# Patient Record
Sex: Male | Born: 1995 | Race: Black or African American | Hispanic: No | Marital: Single | Smoking: Never smoker
Health system: Southern US, Community
[De-identification: ages and names within clinical notes are randomized; demographics above are authoritative.]

---
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
## 2017-06-03 ENCOUNTER — Encounter (HOSPITAL_BASED_OUTPATIENT_CLINIC_OR_DEPARTMENT_OTHER): Payer: Self-pay | Admitting: *Deleted

## 2017-06-03 ENCOUNTER — Other Ambulatory Visit: Payer: Self-pay

## 2017-06-03 ENCOUNTER — Emergency Department (HOSPITAL_BASED_OUTPATIENT_CLINIC_OR_DEPARTMENT_OTHER): Payer: Medicaid Other

## 2017-06-03 ENCOUNTER — Inpatient Hospital Stay (HOSPITAL_BASED_OUTPATIENT_CLINIC_OR_DEPARTMENT_OTHER)
Admission: EM | Admit: 2017-06-03 | Discharge: 2017-06-05 | DRG: 866 | Disposition: A | Discharge status: Home / Self Care | Payer: Medicaid Other | Attending: Internal Medicine | Admitting: Internal Medicine

## 2017-06-03 DIAGNOSIS — R519 Headache, unspecified: Secondary | ICD-10-CM

## 2017-06-03 DIAGNOSIS — R51 Headache: Secondary | ICD-10-CM

## 2017-06-03 DIAGNOSIS — E872 Acidosis: Secondary | ICD-10-CM | POA: Diagnosis present

## 2017-06-03 DIAGNOSIS — T380X5A Adverse effect of glucocorticoids and synthetic analogues, initial encounter: Secondary | ICD-10-CM | POA: Diagnosis not present

## 2017-06-03 DIAGNOSIS — A419 Sepsis, unspecified organism: Secondary | ICD-10-CM | POA: Diagnosis present

## 2017-06-03 DIAGNOSIS — Z560 Unemployment, unspecified: Secondary | ICD-10-CM | POA: Diagnosis not present

## 2017-06-03 DIAGNOSIS — B349 Viral infection, unspecified: Principal | ICD-10-CM | POA: Diagnosis present

## 2017-06-03 DIAGNOSIS — D696 Thrombocytopenia, unspecified: Secondary | ICD-10-CM | POA: Diagnosis present

## 2017-06-03 DIAGNOSIS — F1290 Cannabis use, unspecified, uncomplicated: Secondary | ICD-10-CM | POA: Diagnosis present

## 2017-06-03 DIAGNOSIS — D72829 Elevated white blood cell count, unspecified: Secondary | ICD-10-CM | POA: Diagnosis present

## 2017-06-03 DIAGNOSIS — D72810 Lymphocytopenia: Secondary | ICD-10-CM | POA: Diagnosis present

## 2017-06-03 DIAGNOSIS — A819 Atypical virus infection of central nervous system, unspecified: Secondary | ICD-10-CM | POA: Diagnosis present

## 2017-06-03 DIAGNOSIS — K219 Gastro-esophageal reflux disease without esophagitis: Secondary | ICD-10-CM | POA: Diagnosis present

## 2017-06-03 LAB — CSF CELL COUNT WITH DIFFERENTIAL
RBC Count, CSF: 1 /mm3 — ABNORMAL HIGH
RBC Count, CSF: 7 /mm3 — ABNORMAL HIGH
Tube #: 1
Tube #: 4
WBC, CSF: 1 /mm3 (ref 0–5)
WBC, CSF: 1 /mm3 (ref 0–5)

## 2017-06-03 LAB — COMPREHENSIVE METABOLIC PANEL
ALT: 9 U/L — ABNORMAL LOW (ref 17–63)
AST: 26 U/L (ref 15–41)
Albumin: 4.6 g/dL (ref 3.5–5.0)
Alkaline Phosphatase: 56 U/L (ref 38–126)
Anion gap: 8 (ref 5–15)
BUN: 13 mg/dL (ref 6–20)
CO2: 26 mmol/L (ref 22–32)
Calcium: 9.2 mg/dL (ref 8.9–10.3)
Chloride: 102 mmol/L (ref 101–111)
Creatinine, Ser: 1.13 mg/dL (ref 0.61–1.24)
GFR calc Af Amer: >60 mL/min (ref >60)
GFR calc non Af Amer: >60 mL/min (ref >60)
Glucose, Bld: 97 mg/dL (ref 65–99)
Potassium: 3.2 mmol/L — ABNORMAL LOW (ref 3.5–5.1)
Sodium: 136 mmol/L (ref 135–145)
Total Bilirubin: 1.3 mg/dL — ABNORMAL HIGH (ref 0.3–1.2)
Total Protein: 7.9 g/dL (ref 6.5–8.1)

## 2017-06-03 LAB — CBC WITH DIFFERENTIAL/PLATELET
Basophils Absolute: 0 10*3/uL (ref 0.0–0.1)
Basophils Relative: 0 %
Eosinophils Absolute: 0 10*3/uL (ref 0.0–0.7)
Eosinophils Relative: 0 %
HCT: 45.4 % (ref 39.0–52.0)
Hemoglobin: 16.3 g/dL (ref 13.0–17.0)
Lymphocytes Relative: 8 %
Lymphs Abs: 0.2 10*3/uL — ABNORMAL LOW (ref 0.7–4.0)
MCH: 32.4 pg (ref 26.0–34.0)
MCHC: 35.9 g/dL (ref 30.0–36.0)
MCV: 90.3 fL (ref 78.0–100.0)
Monocytes Absolute: 0 10*3/uL — ABNORMAL LOW (ref 0.1–1.0)
Monocytes Relative: 0 %
Neutro Abs: 2.4 10*3/uL (ref 1.7–7.7)
Neutrophils Relative %: 92 %
Platelets: 112 10*3/uL — ABNORMAL LOW (ref 150–400)
RBC: 5.03 MIL/uL (ref 4.22–5.81)
RDW: 12 % (ref 11.5–15.5)
WBC: 2.6 10*3/uL — ABNORMAL LOW (ref 4.0–10.5)

## 2017-06-03 LAB — I-STAT CG4 LACTIC ACID, ED
Lactic Acid, Venous: 2.47 mmol/L (ref 0.5–1.9)
Lactic Acid, Venous: 1.99 mmol/L — ABNORMAL HIGH (ref 0.5–1.9)
Lactic Acid, Venous: 2.58 mmol/L (ref 0.5–1.9)

## 2017-06-03 LAB — URINALYSIS, ROUTINE W REFLEX MICROSCOPIC
Bilirubin Urine: NEGATIVE
Glucose, UA: NEGATIVE mg/dL
Hgb urine dipstick: NEGATIVE
Ketones, ur: NEGATIVE mg/dL
Leukocytes, UA: NEGATIVE
Nitrite: NEGATIVE
Protein, ur: NEGATIVE mg/dL
Specific Gravity, Urine: 1.01 (ref 1.005–1.030)
pH: 5.5 (ref 5.0–8.0)

## 2017-06-03 LAB — GLUCOSE, CSF: Glucose, CSF: 62 mg/dL (ref 40–70)

## 2017-06-03 LAB — PROTEIN, CSF: Total  Protein, CSF: 33 mg/dL (ref 15–45)

## 2017-06-03 MED ORDER — HYDROMORPHONE HCL 1 MG/ML IJ SOLN
0.5000 mg | Freq: Once | INTRAMUSCULAR | Status: AC
  Administered 2017-06-03: 0.5 mg via INTRAVENOUS
  Filled 2017-06-03: qty 1

## 2017-06-03 MED ORDER — LORAZEPAM 2 MG/ML IJ SOLN
1.0000 mg | Freq: Once | INTRAMUSCULAR | Status: AC
  Administered 2017-06-03: 1 mg via INTRAVENOUS
  Filled 2017-06-03: qty 1

## 2017-06-03 MED ORDER — KETOROLAC TROMETHAMINE 15 MG/ML IJ SOLN
15.0000 mg | Freq: Once | INTRAMUSCULAR | Status: AC
Start: 2017-06-03 — End: 2017-06-03
  Administered 2017-06-03: 15 mg via INTRAVENOUS
  Filled 2017-06-03: qty 1

## 2017-06-03 MED ORDER — SODIUM CHLORIDE 0.9 % IV SOLN
2.0000 g | Freq: Once | INTRAVENOUS | Status: AC
  Administered 2017-06-03: 2 g via INTRAVENOUS
  Filled 2017-06-03: qty 20

## 2017-06-03 MED ORDER — ONDANSETRON HCL 4 MG/2ML IJ SOLN
INTRAMUSCULAR | Status: AC
  Administered 2017-06-03: 4 mg via INTRAVENOUS
  Filled 2017-06-03: qty 2

## 2017-06-03 MED ORDER — DEXAMETHASONE SODIUM PHOSPHATE 10 MG/ML IJ SOLN
10.0000 mg | Freq: Once | INTRAMUSCULAR | Status: AC
  Administered 2017-06-03: 10 mg via INTRAVENOUS
  Filled 2017-06-03: qty 1

## 2017-06-03 MED ORDER — VANCOMYCIN HCL 10 G IV SOLR
1500.0000 mg | Freq: Once | INTRAVENOUS | Status: AC
  Administered 2017-06-03: 1500 mg via INTRAVENOUS
  Filled 2017-06-03: qty 1500

## 2017-06-03 MED ORDER — LIDOCAINE HCL (PF) 1 % IJ SOLN
INTRAMUSCULAR | Status: AC
  Administered 2017-06-03: 5 mL
  Filled 2017-06-03: qty 5

## 2017-06-03 MED ORDER — ONDANSETRON HCL 4 MG/2ML IJ SOLN
4.0000 mg | Freq: Once | INTRAMUSCULAR | Status: AC
  Administered 2017-06-03: 4 mg via INTRAVENOUS

## 2017-06-03 MED ORDER — LACTATED RINGERS IV BOLUS
1000.0000 mL | Freq: Once | INTRAVENOUS | Status: AC
Start: 2017-06-03 — End: 2017-06-03
  Administered 2017-06-03: 1000 mL via INTRAVENOUS

## 2017-06-03 MED ORDER — VANCOMYCIN HCL 1000 MG IV SOLR
INTRAVENOUS | Status: AC
  Filled 2017-06-03: qty 1000

## 2017-06-03 MED ORDER — ACETAMINOPHEN 500 MG PO TABS
ORAL_TABLET | ORAL | Status: AC
  Administered 2017-06-03: 1000 mg
  Filled 2017-06-03: qty 2

## 2017-06-03 MED ORDER — VANCOMYCIN HCL 500 MG IV SOLR
INTRAVENOUS | Status: AC
  Filled 2017-06-03: qty 500

## 2017-06-03 MED ORDER — SODIUM CHLORIDE 0.9 % IV BOLUS
1000.0000 mL | Freq: Once | INTRAVENOUS | Status: AC
  Administered 2017-06-03: 1000 mL via INTRAVENOUS

## 2017-06-03 MED ORDER — LACTATED RINGERS IV BOLUS
1000.0000 mL | Freq: Once | INTRAVENOUS | Status: AC
  Administered 2017-06-03: 1000 mL via INTRAVENOUS
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
## 2017-06-03 NOTE — ED Triage Notes (Signed)
Pt /o " headache x 30 mins "

## 2017-06-03 NOTE — Plan of Care (Addendum)
22 yo M with 30 min headache, no meningismus, subjective fever only, LP neg for bacterial meningitis.  Tachy to 150s, BPs 70s/40s initially.  Improved after IVF.  CXR, UA also neg.  BCX pending.  Leukopenic.  Will send to med-surg.

## 2017-06-03 NOTE — ED Provider Notes (Signed)
Assumed care from Dr. Juleen China at 3:39 PM. Briefly, the patient is a 22 y.o. male with PMHx of  has no past medical history on file. here with headache, acute onset, ? Low-grade temp. Mild leukopenia on exam. CT neg. DDx includes meningitis, likely viral, SAH, migraine. LA elevated, pt given fluids but no abx yet. LP performed by Dr. Juleen China, awaiting results/response to tx..   Labs Reviewed  COMPREHENSIVE METABOLIC PANEL - Abnormal; Notable for the following components:      Result Value   Potassium 3.2 (*)    ALT 9 (*)    Total Bilirubin 1.3 (*)    All other components within normal limits  CBC WITH DIFFERENTIAL/PLATELET - Abnormal; Notable for the following components:   WBC 2.6 (*)    Platelets 112 (*)    Lymphs Abs 0.2 (*)    Monocytes Absolute 0.0 (*)    All other components within normal limits  I-STAT CG4 LACTIC ACID, ED - Abnormal; Notable for the following components:   Lactic Acid, Venous 2.47 (*)    All other components within normal limits  CULTURE, BLOOD (ROUTINE X 2)  CULTURE, BLOOD (ROUTINE X 2)  CSF CULTURE  URINALYSIS, ROUTINE W REFLEX MICROSCOPIC  GLUCOSE, CSF  PROTEIN, CSF  CSF CELL COUNT WITH DIFFERENTIAL  CSF CELL COUNT WITH DIFFERENTIAL  PATHOLOGIST SMEAR REVIEW    Course of Care: -CSF studies reviewed and are not c/w meningitis. On my exam, pt seems to feel unwell but is non-toxic. He's mentating well. HA has improved. Heart RRR, lungs clear. Abdomen soft, NT, ND, no RLQ or other TTP. He has no rash, denies any recent tick bites, recent travel, or other infectious triggers. LA remains elevated. Concern for ongoing sepsis of unclear etiology. He's been given broad abx, steroids, and fluids. I had a long discussion with him and family, will admit for further observation and treatment.  CRITICAL CARE Performed by: Dollene Cleveland   Total critical care time: 45 minutes  Critical care time was exclusive of separately billable procedures and treating other  patients.  Critical care was necessary to treat or prevent imminent or life-threatening deterioration.  Critical care was time spent personally by me on the following activities: development of treatment plan with patient and/or surrogate as well as nursing, discussions with consultants, evaluation of patient's response to treatment, examination of patient, obtaining history from patient or surrogate, ordering and performing treatments and interventions, ordering and review of laboratory studies, ordering and review of radiographic studies, pulse oximetry and re-evaluation of patient's condition.      Shaune Pollack, MD 06/04/17 [phone]

## 2017-06-03 NOTE — ED Notes (Signed)
ED Provider at bedside. 

## 2017-06-04 DIAGNOSIS — R51 Headache: Secondary | ICD-10-CM

## 2017-06-04 DIAGNOSIS — A819 Atypical virus infection of central nervous system, unspecified: Secondary | ICD-10-CM | POA: Diagnosis present

## 2017-06-04 LAB — BLOOD CULTURE ID PANEL (REFLEXED)

## 2017-06-04 LAB — CBC WITH DIFFERENTIAL/PLATELET
BASOS PCT: 0 %
Basophils Absolute: 0 10*3/uL (ref 0.0–0.1)
EOS ABS: 0 10*3/uL (ref 0.0–0.7)
Eosinophils Relative: 0 %
HCT: 40.8 % (ref 39.0–52.0)
Hemoglobin: 14.2 g/dL (ref 13.0–17.0)
Lymphocytes Relative: 2 %
Lymphs Abs: 0.4 10*3/uL — ABNORMAL LOW (ref 0.7–4.0)
MCH: 32.1 pg (ref 26.0–34.0)
MCHC: 34.8 g/dL (ref 30.0–36.0)
MCV: 92.1 fL (ref 78.0–100.0)
Monocytes Absolute: 0.6 10*3/uL (ref 0.1–1.0)
Monocytes Relative: 3 %
NEUTROS ABS: 22 10*3/uL — AB (ref 1.7–7.7)
NEUTROS PCT: 95 %
Platelets: 117 10*3/uL — ABNORMAL LOW (ref 150–400)
RBC: 4.43 MIL/uL (ref 4.22–5.81)
RDW: 12.3 % (ref 11.5–15.5)
WBC: 23 10*3/uL — AB (ref 4.0–10.5)

## 2017-06-04 LAB — COMPREHENSIVE METABOLIC PANEL
ALBUMIN: 3.6 g/dL (ref 3.5–5.0)
ALK PHOS: 48 U/L (ref 38–126)
ALT: 74 U/L — AB (ref 17–63)
ANION GAP: 12 (ref 5–15)
AST: 104 U/L — AB (ref 15–41)
BUN: 12 mg/dL (ref 6–20)
CHLORIDE: 106 mmol/L (ref 101–111)
CO2: 22 mmol/L (ref 22–32)
Calcium: 8.9 mg/dL (ref 8.9–10.3)
Creatinine, Ser: 0.95 mg/dL (ref 0.61–1.24)
GFR calc non Af Amer: >60 mL/min (ref >60)
Glucose, Bld: 112 mg/dL — ABNORMAL HIGH (ref 65–99)
Potassium: 3.8 mmol/L (ref 3.5–5.1)
SODIUM: 140 mmol/L (ref 135–145)
Total Bilirubin: 0.8 mg/dL (ref 0.3–1.2)
Total Protein: 6.7 g/dL (ref 6.5–8.1)

## 2017-06-04 LAB — MONONUCLEOSIS SCREEN: MONO SCREEN: NEGATIVE

## 2017-06-04 LAB — PATHOLOGIST SMEAR REVIEW

## 2017-06-04 MED ORDER — SODIUM CHLORIDE 0.9 % IV SOLN
1.0000 g | Freq: Two times a day (BID) | INTRAVENOUS | Status: DC
  Administered 2017-06-04 – 2017-06-05 (×3): 1 g via INTRAVENOUS
  Filled 2017-06-04: qty 10
  Filled 2017-06-04 (×3): qty 1

## 2017-06-04 MED ORDER — VANCOMYCIN HCL 10 G IV SOLR
1500.0000 mg | Freq: Once | INTRAVENOUS | Status: AC
  Administered 2017-06-04: 1500 mg via INTRAVENOUS
  Filled 2017-06-04: qty 1500

## 2017-06-04 MED ORDER — SODIUM CHLORIDE 0.9 % IV SOLN
INTRAVENOUS | Status: AC
  Administered 2017-06-04: 13:00:00 via INTRAVENOUS

## 2017-06-04 MED ORDER — VANCOMYCIN HCL 10 G IV SOLR
1750.0000 mg | Freq: Two times a day (BID) | INTRAVENOUS | Status: DC
  Administered 2017-06-04 – 2017-06-05 (×2): 1750 mg via INTRAVENOUS
  Filled 2017-06-04 (×2): qty 1750

## 2017-06-04 NOTE — ED Notes (Signed)
Pt alert and oriented x 4.  Denies any issues at present.  Denies blurry vision, no headache,  No weakness, numbness or tingling.  No neuro deficits noted.  Pt aware of admisssion to [facility], denies needs or c/o.

## 2017-06-04 NOTE — ED Notes (Signed)
Care link here to pick up patient 

## 2017-06-04 NOTE — H&P (Signed)
HPI  Jon Frederick ZOX:[id number] DOB: Jun 02, 1995 DOA: 06/03/2017  PCP: Patient, No Pcp Per   Chief Complaint: Headache discomfort  HPI:  22 year old male with no real past medical history other than chest pain evaluated 04/26/2016--[GERD ]reflux-routine echo performed showed no acute findings and it was felt there was not some underlying anxiety There is some past medical history of substance abuse??--Patient however claims not to use any illicit drugs except occasional marijuana he has not used in the past  History of headache-fever-need his mother 5/13 for her tonsillectomy    ED Course: Med center [facility] 5/13-started on ceftriaxone vancomycin Toradol and Dilaudid for severe headaches and tachycardia-LP performed at that time showed no new findings  Review of Systems:  nad No myalgias, - diarrhea, - chills, - Reiger, - lower extremity edema, - rash, - cough cold, mild abdominal pain, - bleeding  History reviewed. No pertinent past medical history.  History reviewed. No pertinent surgical history.   reports that he has never smoked. He has never used smokeless tobacco. His alcohol and drug histories are not on file. Mobility: Dependent at baseline Hi Risk sexual behavior personally, first had sex at age 66 has had over 10 partners Currently unemployed Wishes to breed dogs if no exotic pets at home  No Known Allergies  Family History  Problem Relation Age of Onset  . Sudden death Neg Hx   . Heart attack Neg Hx      Prior to Admission medications   Medication Sig Start Date End Date Taking? Authorizing Provider  naproxen sodium (ALEVE) 220 MG tablet Take 440 mg by mouth 2 (two) times daily as needed (For headache.).    Yes [provider]    Physical Exam:  Vitals:   06/04/17 0800 06/04/17 0815  BP: 133/72   Pulse: 73   Resp: 19   Temp:  97.6 F (36.4 C)  SpO2: 99%      Alert coherent in no distress extraocular movements intact no icterus no pallor  no submandibular lymphadenopathy no thyromegaly chest is clinically clear  S1-S2 no murmur rub or gallop  Skin has tattoos over both forearms-both ears are pierced  No lower extremity edema  S1-S2 I do not appreciate any murmur whatsoever  Range of motion intact no swelling of any joints and musculoskeletal system neurologically intact without deficit  I have personally reviewed following labs and imaging studies  Labs:   Potassium 3.2 lactic acid 2.4  WBC 2.6 platelet count 112 predominant left shift with lymphocytopenia  Imaging studies:   CT head was normal  Chest x-ray was negative for active cardiopulmonary disease  Medical tests:   EKG independently reviewed: Sinus tachycardia with leftward axis although it is possibly secondary to his young age and thin chest wall  Test discussed with performing physician:  Discussed with no one  Decision to obtain old records:   Yes  Review and summation of old records:   Summarized and extensively reviewed  Active Problems:   Sepsis (HCC)   Assessment/Plan  Infectious etiology with leukopenia and thrombocytopenia and mild lymphocytopenia-I will rule out infectious mononucleosis HIV and hepatitis given he has high risk sexual behavior >10 partners CBC plus differential now as well as INR and complete metabolic panel If work-up otherwise unrevealing, will consider de-escalation of antibiotics a.m. 5/15 I will continue however broad-spectrum ceftriaxone vancomycin until that time--follow-up blood cultures that were drawn as well as CSF cultures although the lab work was predominantly benign  Seen in  the past for chest pain  ?  Drug abuse-he uses marijuana does not use now-please consider drug screen if lab abnormalities persist  Severity of Illness: The appropriate patient status for this patient is INPATIENT. Inpatient status is judged to be reasonable and necessary in order to provide the required intensity of service  to ensure the patient's safety. The patient's presenting symptoms, physical exam findings, and initial radiographic and laboratory data in the context of their chronic comorbidities is felt to place them at high risk for further clinical deterioration. Furthermore, it is not anticipated that the patient will be medically stable for discharge from the hospital within 2 midnights of admission. The following factors support the patient status of inpatient.   " The patient's presenting symptoms include leukopenia thrombocytopenia fever-tach acidosis " The worrisome physical exam findings include at this time. " The initial radiographic and laboratory data are worrisome because of the above. " The chronic co-morbidities include none currently.   * I certify that at the point of admission it is my clinical judgment that the patient will require inpatient hospital care spanning beyond 2 midnights from the point of admission due to high intensity of service, high risk for further deterioration and high frequency of surveillance required.*   Full CODE STATUS  DVT prophylaxis: SCD Code Status: full Family Communication: d/w mother Consults called:  None yet    Time spent: 55 minutes  Coty Larsh, MD  [facility] Direct contact: [phone] --Via amion app OR  --[email or web address]; password [email or web address]  7PM-7AM contact night coverage as above  06/04/2017, 10:27 AM

## 2017-06-04 NOTE — Progress Notes (Signed)
PHARMACY - PHYSICIAN COMMUNICATION CRITICAL VALUE ALERT - BLOOD CULTURE IDENTIFICATION (BCID)  Jon Frederick is an 22 y.o. male who presented to [facility] on 06/03/2017 with a chief complaint of headache  Assessment:  Pt was admitted with headache. Pt underwent an LP which was unremarkable. However, the indication on ceftriaxone indicates that it is for meningitis. Spoke to Dr. Mahala Menghini, who clarified that he does not think pt has meningitis. Asked if doses of vanc and ceftriaxone needed to be adjusted to doses to cover meningitis, but Dr. Mahala Menghini stated that they do not as he does not suspect meningitis. However he did want to continue vanc and ceftriaxone at non-meningitis dosing for sepsis. Informed MD also of positive BCID.  Therefore resumed vancomycin per MD request. - Keep ceftriaxone at 1 gr IV q12h - Pt received vancomycin 1500 at 1813 on 5/13 and at 1237 on 5/14, start vancomycin 1750 mg IV q12h    Name of physician (or Provider) Contacted: Dr. Mahala Menghini  Current antibiotics: Ceftriaxone and vancomycin  Changes to prescribed antibiotics recommended:  Recommendations declined by provider due to wanting coverage for at least 24 hours upon admission  Results for orders placed or performed during the hospital encounter of 06/03/17  Blood Culture ID Panel (Reflexed) (Collected: 06/03/2017  4:44 PM)  Result Value Ref Range   Enterococcus species NOT DETECTED NOT DETECTED   Listeria monocytogenes NOT DETECTED NOT DETECTED   Staphylococcus species DETECTED (A) NOT DETECTED   Staphylococcus aureus NOT DETECTED NOT DETECTED   Methicillin resistance NOT DETECTED NOT DETECTED   Streptococcus species NOT DETECTED NOT DETECTED   Streptococcus agalactiae NOT DETECTED NOT DETECTED   Streptococcus pneumoniae NOT DETECTED NOT DETECTED   Streptococcus pyogenes NOT DETECTED NOT DETECTED   Acinetobacter baumannii NOT DETECTED NOT DETECTED   Enterobacteriaceae species NOT DETECTED NOT DETECTED   Enterobacter cloacae complex NOT DETECTED NOT DETECTED   Escherichia coli NOT DETECTED NOT DETECTED   Klebsiella oxytoca NOT DETECTED NOT DETECTED   Klebsiella pneumoniae NOT DETECTED NOT DETECTED   Proteus species NOT DETECTED NOT DETECTED   Serratia marcescens NOT DETECTED NOT DETECTED   Haemophilus influenzae NOT DETECTED NOT DETECTED   Neisseria meningitidis NOT DETECTED NOT DETECTED   Pseudomonas aeruginosa NOT DETECTED NOT DETECTED   Candida albicans NOT DETECTED NOT DETECTED   Candida glabrata NOT DETECTED NOT DETECTED   Candida krusei NOT DETECTED NOT DETECTED   Candida parapsilosis NOT DETECTED NOT DETECTED   Candida tropicalis NOT DETECTED NOT DETECTED     Adalberto Cole, PharmD, BCPS Pager [phone] 06/04/2017 7:33 PM

## 2017-06-05 DIAGNOSIS — B349 Viral infection, unspecified: Principal | ICD-10-CM

## 2017-06-05 LAB — COMPREHENSIVE METABOLIC PANEL
ALBUMIN: 3.3 g/dL — AB (ref 3.5–5.0)
ALT: 70 U/L — AB (ref 17–63)
ANION GAP: 8 (ref 5–15)
AST: 71 U/L — AB (ref 15–41)
Alkaline Phosphatase: 64 U/L (ref 38–126)
BILIRUBIN TOTAL: 0.6 mg/dL (ref 0.3–1.2)
BUN: 11 mg/dL (ref 6–20)
CALCIUM: 8.7 mg/dL — AB (ref 8.9–10.3)
CO2: 23 mmol/L (ref 22–32)
Chloride: 108 mmol/L (ref 101–111)
Creatinine, Ser: 1.01 mg/dL (ref 0.61–1.24)
GFR calc Af Amer: >60 mL/min (ref >60)
Glucose, Bld: 97 mg/dL (ref 65–99)
POTASSIUM: 3.8 mmol/L (ref 3.5–5.1)
SODIUM: 139 mmol/L (ref 135–145)
TOTAL PROTEIN: 6.2 g/dL — AB (ref 6.5–8.1)

## 2017-06-05 LAB — CBC
HEMATOCRIT: 39 % (ref 39.0–52.0)
HEMOGLOBIN: 13.5 g/dL (ref 13.0–17.0)
MCH: 31.8 pg (ref 26.0–34.0)
MCHC: 34.6 g/dL (ref 30.0–36.0)
MCV: 92 fL (ref 78.0–100.0)
PLATELETS: 113 10*3/uL — AB (ref 150–400)
RBC: 4.24 MIL/uL (ref 4.22–5.81)
RDW: 12.5 % (ref 11.5–15.5)
WBC: 17.5 10*3/uL — AB (ref 4.0–10.5)

## 2017-06-05 LAB — PROTIME-INR
INR: 1.37
Prothrombin Time: 16.8 seconds — ABNORMAL HIGH (ref 11.4–15.2)

## 2017-06-05 LAB — HEPATITIS PANEL, ACUTE
Hep A IgM: NEGATIVE
Hep B C IgM: NEGATIVE
Hepatitis B Surface Ag: NEGATIVE

## 2017-06-05 LAB — HIV ANTIBODY (ROUTINE TESTING W REFLEX): HIV SCREEN 4TH GENERATION: NONREACTIVE

## 2017-06-05 MED ORDER — NAPROXEN SODIUM 220 MG PO TABS: 220.0000 mg | ORAL_TABLET | Freq: Two times a day (BID) | ORAL | Status: AC | PRN

## 2017-06-05 NOTE — Discharge Instructions (Signed)

## 2017-06-05 NOTE — Progress Notes (Signed)
Discharge instructions explained to pt and all questions were answered.  

## 2017-06-05 NOTE — Progress Notes (Signed)
Spoke with patient at bedside about PCP and follow up care. He called his mother to see who they regularly see. [facility] an appointment. They understand he needs f/u for labs that were done here. No further d/c needs assessed. [phone]
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
## 2017-06-06 LAB — CULTURE, BLOOD (ROUTINE X 2): Special Requests: ADEQUATE

## 2017-06-07 LAB — CSF CULTURE W GRAM STAIN: Culture: NO GROWTH

## 2017-06-07 LAB — CSF CULTURE

## 2017-06-11 LAB — CULTURE, BLOOD (ROUTINE X 2): Special Requests: ADEQUATE
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
## 2020-01-23 ENCOUNTER — Other Ambulatory Visit: Payer: Self-pay

## 2020-01-23 ENCOUNTER — Emergency Department (HOSPITAL_COMMUNITY)
Admission: EM | Admit: 2020-01-23 | Discharge: 2020-01-23 | Disposition: A | Discharge status: Home / Self Care | Payer: Medicaid Other | Attending: Emergency Medicine | Admitting: Emergency Medicine

## 2020-01-23 ENCOUNTER — Encounter (HOSPITAL_COMMUNITY): Payer: Self-pay | Admitting: Emergency Medicine

## 2020-01-23 DIAGNOSIS — R059 Cough, unspecified: Secondary | ICD-10-CM | POA: Diagnosis present

## 2020-01-23 DIAGNOSIS — U071 COVID-19: Secondary | ICD-10-CM

## 2020-01-23 LAB — RESP PANEL BY RT-PCR (FLU A&B, COVID) ARPGX2
Influenza A by PCR: NEGATIVE
Influenza B by PCR: NEGATIVE
SARS Coronavirus 2 by RT PCR: POSITIVE — AB

## 2020-01-23 NOTE — Discharge Instructions (Addendum)
You will need to quarantine at home for a total of 5 days from symptom onset.  After 5 days, if your symptoms are improving and you no longer having fevers, you may end your quarantine.  You will need to wear a mask consistently for the next 5 days following your quarantine period.  You were diagnosed with Covid, this is a viral illness.  It should be treated symptomatically. Use Tylenol and ibuprofen as needed for fever, headache, or body aches.  You may take 3-4 over-the-counter Advil at a time up to 3 times a day with meals.  Use cough syrup as needed.  Make sure you stay well hydrated with water. Return to the ER if you develop difficulty breathing, severe chest pain, or any new, worsening, or concerning symptoms.

## 2020-01-23 NOTE — ED Triage Notes (Signed)
Pt c/o headache, body aches and fever x 2 days.

## 2020-01-23 NOTE — ED Notes (Signed)
Patient verbalized understanding of discharge instructions. Opportunity for questions and answers.  

## 2020-01-23 NOTE — ED Notes (Signed)
Pt denies sob,chest pain, n and v.

## 2020-01-23 NOTE — ED Provider Notes (Signed)
[facility] EMERGENCY DEPARTMENT Provider Note   CSN: [id number] Arrival date & time: 01/23/20  1648     History Chief Complaint  Patient presents with  . Headache    Jon Frederick is a 25 y.o. male presenting for evaluation of 2 day h/o headache, body ache, fever, cough, nasal congestion, sore throat, decreased appetite.  Patient states that symptoms began 2 days ago.  He took 1 Advil earlier today, has not taken anything since.  He denies known Covid exposure.  He reports a mild, nonproductive cough.  No chest pain or shortness of breath.  He reports body aches, headache.  He reports decreased appetite, but no loss of taste or smell.  He denies nausea, vomiting abdominal pain, urinary symptoms, normal bowel movements.  He has no medical problems, takes no medications daily.  No history of asthma or COPD.  He is not smoke cigarettes.  He is not vaccinated for Covid.  HPI     History reviewed. No pertinent past medical history.  Patient Active Problem List   Diagnosis Date Noted  . Acute viral syndrome 06/05/2017  . Sports physical 06/01/2014    History reviewed. No pertinent surgical history.     Family History  Problem Relation Age of Onset  . Sudden death Neg Hx   . Heart attack Neg Hx     Social History   Tobacco Use  . Smoking status: Never Smoker  . Smokeless tobacco: Never Used    Home Medications Prior to Admission medications   Medication Sig Start Date End Date Taking? Authorizing Provider  naproxen sodium (ALEVE) 220 MG tablet Take 1 tablet (220 mg total) by mouth 2 (two) times daily as needed (For headache.). 06/05/17   Hongalgi, Maximino Greenland, MD    Allergies    Patient has no known allergies.  Review of Systems   Review of Systems  Constitutional: Positive for appetite change and fever.  HENT: Positive for congestion and sore throat.   Respiratory: Positive for cough.   Musculoskeletal: Positive for myalgias.  Neurological: Positive  for headaches.  All other systems reviewed and are negative.   Physical Exam Updated Vital Signs BP (!) 129/99   Pulse 82   Temp 98.1 F (36.7 C) (Oral)   Resp 16   Ht 6\' 3"  (1.905 m)   Wt 88.5 kg   SpO2 100%   BMI 24.37 kg/m   Physical Exam Vitals and nursing note reviewed.  Constitutional:      General: He is not in acute distress.    Appearance: He is well-developed and well-nourished.     Comments: Resting in the bed in no acute distress  HENT:     Head: Normocephalic and atraumatic.  Eyes:     Extraocular Movements: EOM normal.     Conjunctiva/sclera: Conjunctivae normal.     Pupils: Pupils are equal, round, and reactive to light.  Cardiovascular:     Rate and Rhythm: Normal rate and regular rhythm.     Pulses: Intact distal pulses.  Pulmonary:     Effort: Pulmonary effort is normal. No respiratory distress.     Breath sounds: Normal breath sounds. No wheezing.     Comments: Clear lung sounds in all fields.  No wheezing, rales, rhonchi.  Speaking full sentences.  Sats stable on room air. Abdominal:     General: Bowel sounds are normal. There is no distension.     Palpations: Abdomen is soft.     Tenderness: There is  no abdominal tenderness.  Musculoskeletal:        General: Normal range of motion.     Cervical back: Normal range of motion and neck supple.  Skin:    General: Skin is warm and dry.     Capillary Refill: Capillary refill takes less than 2 seconds.  Neurological:     Mental Status: He is alert and oriented to person, place, and time.  Psychiatric:        Mood and Affect: Mood and affect normal.     ED Results / Procedures / Treatments   Labs (all labs ordered are listed, but only abnormal results are displayed) Labs Reviewed  RESP PANEL BY RT-PCR (FLU A&B, COVID) [id number] - Abnormal; Notable for the following components:      Result Value   SARS Coronavirus 2 by RT PCR POSITIVE (*)    All other components within normal limits     EKG None  Radiology No results found.  Procedures Procedures (including critical care time)  Medications Ordered in ED Medications - No data to display  ED Course  I have reviewed the triage vital signs and the nursing notes.  Pertinent labs & imaging results that were available during my care of the patient were reviewed by me and considered in my medical decision making (see chart for details).    MDM Rules/Calculators/A&P                          Patient presenting for evaluation of 2-day history of Covid symptoms.  On exam, patient nontoxic.  Pulmonary exam is overall reassuring.  Sats are stable.  As such, doubt secondary bacterial infection, I do not like he needs a chest x-ray.  Covid test obtained from the waiting room reviewed by me, is positive.  Likely the cause of his symptoms.  Discussed that this is a viral illness that will need to be treated symptomatically.  Discussed importance of monitoring for worsening respiratory symptoms.  At this time, patient appears safe for discharge.  Return precautions given.  Patient states he understands and agrees to plan.  Jon Frederick was evaluated in Emergency Department on 01/23/2020 for the symptoms described in the history of present illness. He was evaluated in the context of the global COVID-19 pandemic, which necessitated consideration that the patient might be at risk for infection with the SARS-CoV-2 virus that causes COVID-19. Institutional protocols and algorithms that pertain to the evaluation of patients at risk for COVID-19 are in a state of rapid change based on information released by regulatory bodies including the CDC and federal and state organizations. These policies and algorithms were followed during the patient's care in the ED.   Final Clinical Impression(s) / ED Diagnoses Final diagnoses:  None    Rx / DC Orders ED Discharge Orders    None       Alveria Apley, PA-C 01/23/20 2143    Derwood Kaplan, MD 01/25/20 2332

## 2020-07-29 ENCOUNTER — Emergency Department (HOSPITAL_COMMUNITY): Payer: Medicaid Other

## 2020-07-29 ENCOUNTER — Other Ambulatory Visit: Payer: Self-pay

## 2020-07-29 ENCOUNTER — Encounter (HOSPITAL_COMMUNITY): Payer: Self-pay | Admitting: Emergency Medicine

## 2020-07-29 ENCOUNTER — Emergency Department (HOSPITAL_COMMUNITY)
Admission: EM | Admit: 2020-07-29 | Discharge: 2020-07-29 | Disposition: A | Discharge status: Home / Self Care | Payer: Medicaid Other | Attending: Emergency Medicine | Admitting: Emergency Medicine

## 2020-07-29 DIAGNOSIS — N23 Unspecified renal colic: Secondary | ICD-10-CM | POA: Diagnosis not present

## 2020-07-29 DIAGNOSIS — K389 Disease of appendix, unspecified: Secondary | ICD-10-CM

## 2020-07-29 DIAGNOSIS — R109 Unspecified abdominal pain: Secondary | ICD-10-CM | POA: Diagnosis present

## 2020-07-29 DIAGNOSIS — K381 Appendicular concretions: Secondary | ICD-10-CM | POA: Diagnosis not present

## 2020-07-29 LAB — COMPREHENSIVE METABOLIC PANEL
ALT: 26 U/L (ref 0–44)
AST: 26 U/L (ref 15–41)
Albumin: 3.8 g/dL (ref 3.5–5.0)
Alkaline Phosphatase: 59 U/L (ref 38–126)
Anion gap: 7 (ref 5–15)
BUN: 11 mg/dL (ref 6–20)
CO2: 27 mmol/L (ref 22–32)
Calcium: 9.2 mg/dL (ref 8.9–10.3)
Chloride: 102 mmol/L (ref 98–111)
Creatinine, Ser: 1.12 mg/dL (ref 0.61–1.24)
GFR, Estimated: >60 mL/min (ref >60)
Glucose, Bld: 106 mg/dL — ABNORMAL HIGH (ref 70–99)
Potassium: 4.2 mmol/L (ref 3.5–5.1)
Sodium: 136 mmol/L (ref 135–145)
Total Bilirubin: 0.7 mg/dL (ref 0.3–1.2)
Total Protein: 6.8 g/dL (ref 6.5–8.1)

## 2020-07-29 LAB — URINALYSIS, ROUTINE W REFLEX MICROSCOPIC
Bacteria, UA: NONE SEEN
Bilirubin Urine: NEGATIVE
Glucose, UA: NEGATIVE mg/dL
Ketones, ur: NEGATIVE mg/dL
Nitrite: NEGATIVE
Protein, ur: NEGATIVE mg/dL
Specific Gravity, Urine: 1.023 (ref 1.005–1.030)
pH: 6 (ref 5.0–8.0)

## 2020-07-29 LAB — CBC
HCT: 43.8 % (ref 39.0–52.0)
Hemoglobin: 14.8 g/dL (ref 13.0–17.0)
MCH: 31 pg (ref 26.0–34.0)
MCHC: 33.8 g/dL (ref 30.0–36.0)
MCV: 91.8 fL (ref 80.0–100.0)
Platelets: 155 10*3/uL (ref 150–400)
RBC: 4.77 MIL/uL (ref 4.22–5.81)
RDW: 12.1 % (ref 11.5–15.5)
WBC: 4.7 10*3/uL (ref 4.0–10.5)
nRBC: 0 % (ref 0.0–0.2)

## 2020-07-29 LAB — LIPASE, BLOOD: Lipase: 28 U/L (ref 11–51)

## 2020-07-29 MED ORDER — FENTANYL CITRATE (PF) 100 MCG/2ML IJ SOLN
25.0000 ug | Freq: Once | INTRAMUSCULAR | Status: AC
  Administered 2020-07-29: 25 ug via INTRAVENOUS
  Filled 2020-07-29: qty 2

## 2020-07-29 MED ORDER — SODIUM CHLORIDE 0.9 % IV BOLUS
1000.0000 mL | Freq: Once | INTRAVENOUS | Status: AC
  Administered 2020-07-29: 1000 mL via INTRAVENOUS

## 2020-07-29 MED ORDER — KETOROLAC TROMETHAMINE 30 MG/ML IJ SOLN
30.0000 mg | Freq: Once | INTRAMUSCULAR | Status: AC
  Administered 2020-07-29: 30 mg via INTRAVENOUS
  Filled 2020-07-29: qty 1

## 2020-07-29 MED ORDER — IBUPROFEN 600 MG PO TABS: 600.0000 mg | ORAL_TABLET | Freq: Four times a day (QID) | ORAL | 0 refills | Status: AC | PRN

## 2020-07-29 NOTE — ED Provider Notes (Addendum)
[facility] EMERGENCY DEPARTMENT Provider Note   CSN: [id number] Arrival date & time: 07/29/20  0258     History Chief Complaint  Patient presents with   Abdominal Pain   Back Pain    Jon Frederick is a 25 y.o. male who presented with left flank pain.  Left flank pain radiates down the left groin since yesterday.  He states that it is cramping sensation and is constant and occasionally sharp.  Patient denies any history of kidney stones or blood in his urine.  Denies any trouble urinating.  Denies any vomiting or fevers.  The history is provided by the patient.      History reviewed. No pertinent past medical history.  Patient Active Problem List   Diagnosis Date Noted   Acute viral syndrome 06/05/2017   Sports physical 06/01/2014    History reviewed. No pertinent surgical history.     Family History  Problem Relation Age of Onset   Sudden death Neg Hx    Heart attack Neg Hx     Social History   Tobacco Use   Smoking status: Never   Smokeless tobacco: Never  Substance Use Topics   Alcohol use: Yes   Drug use: Yes    Types: Marijuana    Home Medications Prior to Admission medications   Medication Sig Start Date End Date Taking? Authorizing Provider  ibuprofen (ADVIL) 600 MG tablet Take 1 tablet (600 mg total) by mouth every 6 (six) hours as needed. 07/29/20  Yes Charlynne Pander, MD  naproxen sodium (ALEVE) 220 MG tablet Take 1 tablet (220 mg total) by mouth 2 (two) times daily as needed (For headache.). Patient not taking: Reported on 07/29/2020 06/05/17   Elease Etienne, MD    Allergies    Patient has no known allergies.  Review of Systems   Review of Systems  Gastrointestinal:  Positive for abdominal pain.  Musculoskeletal:  Positive for back pain.  All other systems reviewed and are negative.  Physical Exam Updated Vital Signs BP 107/60 (BP Location: Left Arm)   Pulse (!) 54   Temp 97.8 F (36.6 C) (Oral)   Resp 16   Ht 6'  3" (1.905 m)   Wt 104.3 kg   SpO2 100%   BMI 28.75 kg/m   Physical Exam Vitals and nursing note reviewed.  Constitutional:      Comments: Slightly uncomfortable   HENT:     Head: Normocephalic.     Mouth/Throat:     Mouth: Mucous membranes are moist.  Eyes:     Extraocular Movements: Extraocular movements intact.     Pupils: Pupils are equal, round, and reactive to light.  Cardiovascular:     Rate and Rhythm: Normal rate and regular rhythm.  Abdominal:     General: Abdomen is flat.     Comments: Mild left CVA tenderness and left upper quadrant tenderness.  No left lower quadrant tenderness  Genitourinary:    Comments: Testicles nontender and normal cremasteric reflex bilaterally. Skin:    General: Skin is warm.     Capillary Refill: Capillary refill takes less than 2 seconds.  Neurological:     General: No focal deficit present.     Mental Status: He is alert and oriented to person, place, and time.  Psychiatric:        Behavior: Behavior normal.    ED Results / Procedures / Treatments   Labs (all labs ordered are listed, but only abnormal results are displayed)  Labs Reviewed  COMPREHENSIVE METABOLIC PANEL - Abnormal; Notable for the following components:      Result Value   Glucose, Bld 106 (*)    All other components within normal limits  URINALYSIS, ROUTINE W REFLEX MICROSCOPIC - Abnormal; Notable for the following components:   Hgb urine dipstick SMALL (*)    Leukocytes,Ua TRACE (*)    All other components within normal limits  LIPASE, BLOOD  CBC    EKG None  Radiology CT Renal Stone Study  Result Date: 07/29/2020 CLINICAL DATA:  25 year old male with history of left-sided flank and abdominal pain. Suspected kidney stone. EXAM: CT ABDOMEN AND PELVIS WITHOUT CONTRAST TECHNIQUE: Multidetector CT imaging of the abdomen and pelvis was performed following the standard protocol without IV contrast. COMPARISON:  No priors. FINDINGS: Lower chest: Unremarkable.  Hepatobiliary: No suspicious cystic or solid hepatic lesions are confidently identified on today's noncontrast CT examination. Unenhanced appearance of the gallbladder is normal. Pancreas: No definite pancreatic mass or peripancreatic fluid collections or inflammatory changes are noted on today's noncontrast CT examination. Spleen: Unremarkable. Adrenals/Urinary Tract: No calcifications are identified within the collecting system of either kidney, along the course of either ureter, or within the lumen of the urinary bladder. No hydroureteronephrosis. Unenhanced appearance of the kidneys and bilateral adrenal glands is normal. Urinary bladder is nearly completely decompressed, but otherwise unremarkable in appearance. Stomach/Bowel: Unenhanced appearance of the stomach is normal. No pathologic dilatation of small bowel or colon. Appendicolith at the ostium of the appendix. Appendix is mildly dilated without overt surrounding inflammatory changes. Appendix: Location: Medial and posterior to the cecum. Diameter: 10 mm Appendicolith: Present at the ostium Extraluminal gas: None Periappendiceal collection: None Vascular/Lymphatic: No atherosclerotic calcifications in the abdominal aorta or pelvic vasculature. No lymphadenopathy noted in the abdomen or pelvis. Reproductive: Prostate gland and seminal vesicles are unremarkable in appearance. Other: No significant volume of ascites.  No pneumoperitoneum. Musculoskeletal: There are no aggressive appearing lytic or blastic lesions noted in the visualized portions of the skeleton. IMPRESSION: 1. No urinary tract calculi. No findings of urinary tract obstruction. 2. Mildly dilated appendix with appendicolith at the ostium. However, there are no overt surrounding inflammatory changes, and there is no periappendiceal fluid collection. Further clinical evaluation to assess for potential signs and symptoms of acute appendicitis is recommended. Electronically Signed   By: Trudie Reed M.D.   On: 07/29/2020 06:50    Procedures Procedures   Medications Ordered in ED Medications  sodium chloride 0.9 % bolus 1,000 mL (1,000 mLs Intravenous New Bag/Given 07/29/20 0648)  ketorolac (TORADOL) 30 MG/ML injection 30 mg (30 mg Intravenous Given 07/29/20 0627)  fentaNYL (SUBLIMAZE) injection 25 mcg (25 mcg Intravenous Given 07/29/20 3893)    ED Course  I have reviewed the triage vital signs and the nursing notes.  Pertinent labs & imaging results that were available during my care of the patient were reviewed by me and considered in my medical decision making (see chart for details).    MDM Rules/Calculators/A&P                         Nochum Fenter is a 25 y.o. male here presenting with left flank pain.  Acute onset over the last 24 hours.  I think likely renal colic versus pyelonephritis versus gastritis.  Plan to get CBC and CMP and urinalysis.  If urinalysis shows hematuria will likely need CT renal stone.    7:04 AM CT showed appendicolith with  no obvious appendicitis.  Patient's white blood cell count is normal.  Patient does have some blood in his urine and I wonder if he passed a kidney stone.  Patient feeling better after some Toradol and fentanyl and some IV fluids.  I reassessed patient and patient still does not have any periumbilical or right lower quadrant tenderness. I told him that if he start developing right lower quadrant pain or fever that he needs to return for evaluation.  Otherwise he can stay hydrated and take Motrin for pain.    Final Clinical Impression(s) / ED Diagnoses Final diagnoses:  Renal colic on left side  Appendicolith    Rx / DC Orders ED Discharge Orders          Ordered    ibuprofen (ADVIL) 600 MG tablet  Every 6 hours PRN        07/29/20 0700             Charlynne Pander, MD 07/29/20 5993    Charlynne Pander, MD 07/29/20 [phone]

## 2020-07-29 NOTE — Discharge Instructions (Addendum)
You likely passed a kidney stone.  Take motrin for pain   Stay hydrated  You have an appendicolith but no obvious signs of appendicitis.  If you start developing right lower quadrant pain and fever please return to the ER.  Return to ER if you have severe abdominal pain, vomiting.

## 2020-07-29 NOTE — ED Triage Notes (Addendum)
Pt c/o LLQ abdominal pain with radiation to left flank, tenderness on palpation, onset yesterday morning. Denies Fevers/chills, urinary symptoms, NVD. Denies injury, mobility intact. VS WDL. No acute distress in triage.

## 2021-04-29 ENCOUNTER — Encounter (HOSPITAL_COMMUNITY): Payer: Self-pay | Admitting: Emergency Medicine

## 2021-04-29 ENCOUNTER — Ambulatory Visit (HOSPITAL_COMMUNITY)
Admission: EM | Admit: 2021-04-29 | Discharge: 2021-04-29 | Disposition: A | Discharge status: Home / Self Care | Payer: Medicaid Other | Attending: Internal Medicine | Admitting: Internal Medicine

## 2021-04-29 DIAGNOSIS — A5903 Trichomonal cystitis and urethritis: Secondary | ICD-10-CM | POA: Insufficient documentation

## 2021-04-29 DIAGNOSIS — Z20822 Contact with and (suspected) exposure to covid-19: Secondary | ICD-10-CM | POA: Insufficient documentation

## 2021-04-29 DIAGNOSIS — Z113 Encounter for screening for infections with a predominantly sexual mode of transmission: Secondary | ICD-10-CM | POA: Insufficient documentation

## 2021-04-29 DIAGNOSIS — J029 Acute pharyngitis, unspecified: Secondary | ICD-10-CM | POA: Diagnosis not present

## 2021-04-29 DIAGNOSIS — R300 Dysuria: Secondary | ICD-10-CM | POA: Insufficient documentation

## 2021-04-29 DIAGNOSIS — J069 Acute upper respiratory infection, unspecified: Secondary | ICD-10-CM | POA: Diagnosis not present

## 2021-04-29 LAB — COMPREHENSIVE METABOLIC PANEL
ALT: 32 U/L (ref 0–44)
AST: 56 U/L — ABNORMAL HIGH (ref 15–41)
Albumin: 4 g/dL (ref 3.5–5.0)
Alkaline Phosphatase: 50 U/L (ref 38–126)
Anion gap: 9 (ref 5–15)
BUN: 13 mg/dL (ref 6–20)
CO2: 27 mmol/L (ref 22–32)
Calcium: 9.4 mg/dL (ref 8.9–10.3)
Chloride: 101 mmol/L (ref 98–111)
Creatinine, Ser: 0.94 mg/dL (ref 0.61–1.24)
GFR, Estimated: >60 mL/min (ref >60)
Glucose, Bld: 85 mg/dL (ref 70–99)
Potassium: 4.7 mmol/L (ref 3.5–5.1)
Sodium: 137 mmol/L (ref 135–145)
Total Bilirubin: 1.1 mg/dL (ref 0.3–1.2)
Total Protein: 7.1 g/dL (ref 6.5–8.1)

## 2021-04-29 LAB — CBC
HCT: 45.5 % (ref 39.0–52.0)
Hemoglobin: 15 g/dL (ref 13.0–17.0)
MCH: 30.6 pg (ref 26.0–34.0)
MCHC: 33 g/dL (ref 30.0–36.0)
MCV: 92.9 fL (ref 80.0–100.0)
Platelets: 144 10*3/uL — ABNORMAL LOW (ref 150–400)
RBC: 4.9 MIL/uL (ref 4.22–5.81)
RDW: 12.4 % (ref 11.5–15.5)
WBC: 5 10*3/uL (ref 4.0–10.5)
nRBC: 0 % (ref 0.0–0.2)

## 2021-04-29 LAB — POCT RAPID STREP A, ED / UC: Streptococcus, Group A Screen (Direct): NEGATIVE

## 2021-04-29 LAB — POCT URINALYSIS DIPSTICK, ED / UC
Bilirubin Urine: NEGATIVE
Glucose, UA: NEGATIVE mg/dL
Hgb urine dipstick: NEGATIVE
Ketones, ur: NEGATIVE mg/dL
Leukocytes,Ua: NEGATIVE
Nitrite: NEGATIVE
Protein, ur: NEGATIVE mg/dL
Specific Gravity, Urine: 1.02 (ref 1.005–1.030)
Urobilinogen, UA: 2 mg/dL — ABNORMAL HIGH (ref 0.0–1.0)
pH: 7 (ref 5.0–8.0)

## 2021-04-29 LAB — HIV ANTIBODY (ROUTINE TESTING W REFLEX): HIV Screen 4th Generation wRfx: NONREACTIVE

## 2021-04-29 NOTE — ED Triage Notes (Signed)
Pt is present today with dysuria, sore throat, and cough. Pt is also concern for STD. Pt sx started x2 days ago   ?

## 2021-04-29 NOTE — Discharge Instructions (Signed)
Your rapid strep is negative.  Throat culture, COVID test, blood work, penile swab, urine culture are all pending.  We will call if they are positive and treat as appropriate.  Please refrain from sexual activity until test results and treatment are complete. ?

## 2021-04-29 NOTE — ED Provider Notes (Signed)
?[place] ? ? ? ?CSN: [id number] ?Arrival date & time: 04/29/21  1225 ? ? ?  ? ?History   ?Chief Complaint ?Chief Complaint  ?Patient presents with  ? Dysuria  ? Cough  ? Sore Throat  ? ? ?HPI ?Jon Frederick is a 26 y.o. male.  ? ?Patient presents with cough, sore throat, dysuria.  She reports that all the symptoms started a few days prior at the same time.  Denies any associated fevers or known sick contacts.  Patient denies nasal congestion, chest pain, shortness of breath, ear pain, nausea, vomiting, diarrhea, abdominal pain.  Patient is taking cough drops for upper respiratory symptoms with minimal improvement.  Also have urinary burning but denies any penile discharge, testicular pain, urinary frequency, back pain, abdominal pain.  Denies any known exposure to STD but has had unprotected sexual intercourse recently.  Patient requesting STD testing for HIV and syphilis as well. ? ? ?Dysuria ?Cough ?Sore Throat ? ? ?History reviewed. No pertinent past medical history. ? ?Patient Active Problem List  ? Diagnosis Date Noted  ? Acute viral syndrome 06/05/2017  ? Sports physical 06/01/2014  ? ? ?History reviewed. No pertinent surgical history. ? ? ? ? ?Home Medications   ? ?Prior to Admission medications   ?Medication Sig Start Date End Date Taking? Authorizing Provider  ?ibuprofen (ADVIL) 600 MG tablet Take 1 tablet (600 mg total) by mouth every 6 (six) hours as needed. 07/29/20   Drenda Freeze, MD  ?naproxen sodium (ALEVE) 220 MG tablet Take 1 tablet (220 mg total) by mouth 2 (two) times daily as needed (For headache.). ?Patient not taking: Reported on 07/29/2020 06/05/17   Modena Jansky, MD  ? ? ?Family History ?Family History  ?Problem Relation Age of Onset  ? Sudden death Neg Hx   ? Heart attack Neg Hx   ? ? ?Social History ?Social History  ? ?Tobacco Use  ? Smoking status: Never  ? Smokeless tobacco: Never  ?Substance Use Topics  ? Alcohol use: Yes  ? Drug use: Yes  ?  Types: Marijuana   ? ? ? ?Allergies   ?Patient has no known allergies. ? ? ?Review of Systems ?Review of Systems ?Per HPI ? ?Physical Exam ?Triage Vital Signs ?ED Triage Vitals  ?Enc Vitals Group  ?   BP 04/29/21 1323 137/84  ?   Pulse Rate 04/29/21 1323 (!) 52  ?   Resp 04/29/21 1323 18  ?   Temp 04/29/21 1323 98 ?F (36.7 ?C)  ?   Temp Source 04/29/21 1323 Oral  ?   SpO2 04/29/21 1323 99 %  ?   Weight --   ?   Height --   ?   Head Circumference --   ?   Peak Flow --   ?   Pain Score 04/29/21 1322 0  ?   Pain Loc --   ?   Pain Edu? --   ?   Excl. in [place]? --   ? ?No data found. ? ?Updated Vital Signs ?BP 137/84   Pulse (!) 52   Temp 98 ?F (36.7 ?C) (Oral)   Resp 18   SpO2 99%  ? ?Visual Acuity ?Right Eye Distance:   ?Left Eye Distance:   ?Bilateral Distance:   ? ?Right Eye Near:   ?Left Eye Near:    ?Bilateral Near:    ? ?Physical Exam ?Constitutional:   ?   General: He is not in acute distress. ?   Appearance:  Normal appearance. He is not toxic-appearing or diaphoretic.  ?HENT:  ?   Head: Normocephalic and atraumatic.  ?   Right Ear: Tympanic membrane and ear canal normal.  ?   Left Ear: Tympanic membrane and ear canal normal.  ?   Nose: Congestion present.  ?   Mouth/Throat:  ?   Mouth: Mucous membranes are moist.  ?   Pharynx: Posterior oropharyngeal erythema present.  ?Eyes:  ?   Extraocular Movements: Extraocular movements intact.  ?   Conjunctiva/sclera: Conjunctivae normal.  ?   Pupils: Pupils are equal, round, and reactive to light.  ?Cardiovascular:  ?   Rate and Rhythm: Normal rate and regular rhythm.  ?   Pulses: Normal pulses.  ?   Heart sounds: Normal heart sounds.  ?Pulmonary:  ?   Effort: Pulmonary effort is normal. No respiratory distress.  ?   Breath sounds: Normal breath sounds. No stridor. No wheezing, rhonchi or rales.  ?Abdominal:  ?   General: Abdomen is flat. Bowel sounds are normal.  ?   Palpations: Abdomen is soft.  ?Genitourinary: ?   Comments: Deferred with shared decision making.  Self swab  performed. ?Musculoskeletal:     ?   General: Normal range of motion.  ?   Cervical back: Normal range of motion.  ?Skin: ?   General: Skin is warm and dry.  ?Neurological:  ?   General: No focal deficit present.  ?   Mental Status: He is alert and oriented to person, place, and time. Mental status is at baseline.  ?Psychiatric:     ?   Mood and Affect: Mood normal.     ?   Behavior: Behavior normal.  ? ? ? ?UC Treatments / Results  ?Labs ?(all labs ordered are listed, but only abnormal results are displayed) ?Labs Reviewed  ?POCT URINALYSIS DIPSTICK, ED / UC - Abnormal; Notable for the following components:  ?    Result Value  ? Urobilinogen, UA 2.0 (*)   ? All other components within normal limits  ?URINE CULTURE  ?CULTURE, GROUP A STREP [facility])  ?SARS CORONAVIRUS 2 (TAT 6-24 HRS)  ?COMPREHENSIVE METABOLIC PANEL  ?CBC  ?HIV ANTIBODY (ROUTINE TESTING W REFLEX)  ?RPR  ?POCT RAPID STREP A, ED / UC  ?CYTOLOGY, (ORAL, ANAL, URETHRAL) ANCILLARY ONLY  ? ? ?EKG ? ? ?Radiology ?No results found. ? ?Procedures ?Procedures (including critical care time) ? ?Medications Ordered in UC ?Medications - No data to display ? ?Initial Impression / Assessment and Plan / UC Course  ?I have reviewed the triage vital signs and the nursing notes. ? ?Pertinent labs & imaging results that were available during my care of the patient were reviewed by me and considered in my medical decision making (see chart for details). ? ?  ? ?Urinalysis does not show signs of urinary tract infection.  mildly elevated urobilinogen on urinalysis.  CMP and CBC pending to rule out worrisome etiologies associated with this.  No abdominal pain or jaundice noted at this time so do not think the patient is in need of emergent referral to the hospital due to this.  Suspect patient's dysuria could be from STD.  Cytology swab pending. urine culture is also pending.  Patient's upper respiratory symptoms with cough could be viral in nature, although HIV test is  pending to rule out this etiology of flulike symptoms.  Rapid strep was negative.  Throat culture and COVID test are pending.  Patient to refrain from sexual activity until  test results and treatment are complete.  Discussed supportive care and symptom management for viral symptoms with patient.  Discussed return precautions.  Patient verbalized understanding and was agreeable with plan. ?Final Clinical Impressions(s) / UC Diagnoses  ? ?Final diagnoses:  ?Dysuria  ?Screening examination for venereal disease  ?Viral URI with cough  ?Sore throat  ? ? ? ?Discharge Instructions   ? ?  ?Your rapid strep is negative.  Throat culture, COVID test, blood work, penile swab, urine culture are all pending.  We will call if they are positive and treat as appropriate.  Please refrain from sexual activity until test results and treatment are complete. ? ? ? ? ?ED Prescriptions   ?None ?  ? ?PDMP not reviewed this encounter. ?  ?Teodora Medici, [place] ?04/29/21 1530 ? ?

## 2021-04-30 LAB — URINE CULTURE: Culture: <10000 — AB

## 2021-04-30 LAB — RPR: RPR Ser Ql: NONREACTIVE

## 2021-04-30 LAB — SARS CORONAVIRUS 2 (TAT 6-24 HRS): SARS Coronavirus 2: NEGATIVE

## 2021-05-01 LAB — CYTOLOGY, (ORAL, ANAL, URETHRAL) ANCILLARY ONLY
Chlamydia: NEGATIVE
Comment: NEGATIVE
Comment: NEGATIVE
Comment: NORMAL
Neisseria Gonorrhea: NEGATIVE
Trichomonas: POSITIVE — AB

## 2021-05-02 ENCOUNTER — Telehealth (HOSPITAL_COMMUNITY): Payer: Self-pay | Admitting: Emergency Medicine

## 2021-05-02 LAB — CULTURE, GROUP A STREP (THRC)

## 2021-05-02 MED ORDER — METRONIDAZOLE 500 MG PO TABS: 2000.0000 mg | ORAL_TABLET | Freq: Once | ORAL | 0 refills | Status: AC

## 2021-05-03 ENCOUNTER — Telehealth (HOSPITAL_COMMUNITY): Payer: Self-pay | Admitting: Emergency Medicine

## 2021-05-03 NOTE — Telephone Encounter (Signed)
Per Hildred Alamin, APP "looks like slightly low platelets and slightly elevated AST. He will need to have those redrawn in a few weeks with PCP. " On patient's recent blood work.   ?Attempted to reach patient x 1, VM is not setup ?

## 2021-05-04 NOTE — Telephone Encounter (Signed)
Reviewed with patient, no questions at this time
